# Patient Record
Sex: Female | Born: 1995 | Hispanic: No | Marital: Single | Smoking: Never smoker
Health system: Southern US, Community
[De-identification: ages and names within clinical notes are randomized; demographics above are authoritative.]

## PROBLEM LIST (undated history)

## (undated) ENCOUNTER — Inpatient Hospital Stay: Payer: Self-pay

## (undated) ENCOUNTER — Inpatient Hospital Stay (HOSPITAL_COMMUNITY): Payer: Self-pay

## (undated) DIAGNOSIS — K5090 Crohn's disease, unspecified, without complications: Secondary | ICD-10-CM

## (undated) DIAGNOSIS — R000 Tachycardia, unspecified: Secondary | ICD-10-CM

## (undated) DIAGNOSIS — B019 Varicella without complication: Secondary | ICD-10-CM

## (undated) DIAGNOSIS — D649 Anemia, unspecified: Secondary | ICD-10-CM

## (undated) DIAGNOSIS — G43909 Migraine, unspecified, not intractable, without status migrainosus: Secondary | ICD-10-CM

## (undated) HISTORY — DX: Varicella without complication: B01.9

## (undated) HISTORY — DX: Anemia, unspecified: D64.9

## (undated) HISTORY — DX: Migraine, unspecified, not intractable, without status migrainosus: G43.909

---
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
## 2015-04-27 HISTORY — PX: COLONOSCOPY: SHX174

## 2015-09-06 ENCOUNTER — Encounter: Payer: Self-pay | Admitting: *Deleted

## 2015-09-11 DIAGNOSIS — R0989 Other specified symptoms and signs involving the circulatory and respiratory systems: Secondary | ICD-10-CM

## 2015-09-12 ENCOUNTER — Encounter: Payer: Medicaid Other | Admitting: Cardiovascular Disease

## 2015-09-12 ENCOUNTER — Encounter: Payer: Self-pay | Admitting: Cardiovascular Disease

## 2015-09-13 NOTE — Progress Notes (Signed)
Patient ID: Cassidy Carr, female   DOB: 10-11-1996, 19 y.o.   MRN: [id number] No Show

## 2015-09-14 ENCOUNTER — Inpatient Hospital Stay (HOSPITAL_COMMUNITY): Payer: Medicaid Other

## 2015-09-14 ENCOUNTER — Inpatient Hospital Stay (HOSPITAL_COMMUNITY)
Admission: AD | Admit: 2015-09-14 | Discharge: 2015-09-19 | DRG: 781 | Disposition: A | Discharge status: Home / Self Care | Payer: Medicaid Other | Source: Ambulatory Visit | Attending: Obstetrics and Gynecology | Admitting: Obstetrics and Gynecology

## 2015-09-14 ENCOUNTER — Encounter (HOSPITAL_COMMUNITY): Payer: Self-pay | Admitting: *Deleted

## 2015-09-14 ENCOUNTER — Encounter: Payer: Self-pay | Admitting: Cardiovascular Disease

## 2015-09-14 DIAGNOSIS — R509 Fever, unspecified: Secondary | ICD-10-CM | POA: Diagnosis present

## 2015-09-14 DIAGNOSIS — R059 Cough, unspecified: Secondary | ICD-10-CM

## 2015-09-14 DIAGNOSIS — Z3A23 23 weeks gestation of pregnancy: Secondary | ICD-10-CM

## 2015-09-14 DIAGNOSIS — O99012 Anemia complicating pregnancy, second trimester: Secondary | ICD-10-CM | POA: Diagnosis present

## 2015-09-14 DIAGNOSIS — K50111 Crohn's disease of large intestine with rectal bleeding: Secondary | ICD-10-CM | POA: Diagnosis present

## 2015-09-14 DIAGNOSIS — E876 Hypokalemia: Secondary | ICD-10-CM | POA: Diagnosis present

## 2015-09-14 DIAGNOSIS — D638 Anemia in other chronic diseases classified elsewhere: Secondary | ICD-10-CM | POA: Diagnosis present

## 2015-09-14 DIAGNOSIS — O99612 Diseases of the digestive system complicating pregnancy, second trimester: Secondary | ICD-10-CM | POA: Diagnosis present

## 2015-09-14 DIAGNOSIS — O99282 Endocrine, nutritional and metabolic diseases complicating pregnancy, second trimester: Secondary | ICD-10-CM | POA: Diagnosis present

## 2015-09-14 DIAGNOSIS — E871 Hypo-osmolality and hyponatremia: Secondary | ICD-10-CM | POA: Diagnosis present

## 2015-09-14 DIAGNOSIS — K50918 Crohn's disease, unspecified, with other complication: Secondary | ICD-10-CM | POA: Diagnosis present

## 2015-09-14 DIAGNOSIS — R05 Cough: Secondary | ICD-10-CM

## 2015-09-14 HISTORY — DX: Tachycardia, unspecified: R00.0

## 2015-09-14 LAB — COMPREHENSIVE METABOLIC PANEL
ALT: 9 U/L — ABNORMAL LOW (ref 14–54)
AST: 15 U/L (ref 15–41)
Albumin: 2.1 g/dL — ABNORMAL LOW (ref 3.5–5.0)
Alkaline Phosphatase: 61 U/L (ref 38–126)
Anion gap: 8 (ref 5–15)
BUN: 7 mg/dL (ref 6–20)
CO2: 24 mmol/L (ref 22–32)
Calcium: 7.8 mg/dL — ABNORMAL LOW (ref 8.9–10.3)
Chloride: 99 mmol/L — ABNORMAL LOW (ref 101–111)
Creatinine, Ser: 0.47 mg/dL (ref 0.44–1.00)
GFR calc Af Amer: >60 mL/min (ref >60)
GFR calc non Af Amer: >60 mL/min (ref >60)
Glucose, Bld: 111 mg/dL — ABNORMAL HIGH (ref 65–99)
Potassium: 3 mmol/L — ABNORMAL LOW (ref 3.5–5.1)
Sodium: 131 mmol/L — ABNORMAL LOW (ref 135–145)
Total Bilirubin: 0.8 mg/dL (ref 0.3–1.2)
Total Protein: 6.7 g/dL (ref 6.5–8.1)

## 2015-09-14 LAB — CBC
HCT: 30.9 % — ABNORMAL LOW (ref 36.0–46.0)
Hemoglobin: 9.7 g/dL — ABNORMAL LOW (ref 12.0–15.0)
MCH: 26.3 pg (ref 26.0–34.0)
MCHC: 31.4 g/dL (ref 30.0–36.0)
MCV: 83.7 fL (ref 78.0–100.0)
Platelets: 413 10*3/uL — ABNORMAL HIGH (ref 150–400)
RBC: 3.69 MIL/uL — ABNORMAL LOW (ref 3.87–5.11)
RDW: 18.1 % — ABNORMAL HIGH (ref 11.5–15.5)
WBC: 12.5 10*3/uL — ABNORMAL HIGH (ref 4.0–10.5)

## 2015-09-14 LAB — URINALYSIS, ROUTINE W REFLEX MICROSCOPIC
Bilirubin Urine: NEGATIVE
Glucose, UA: NEGATIVE mg/dL
Hgb urine dipstick: NEGATIVE
Ketones, ur: 15 mg/dL — AB
Leukocytes, UA: NEGATIVE
Nitrite: NEGATIVE
Protein, ur: NEGATIVE mg/dL
Specific Gravity, Urine: 1.02 (ref 1.005–1.030)
pH: 5.5 (ref 5.0–8.0)

## 2015-09-14 MED ORDER — POTASSIUM CHLORIDE 20 MEQ/15ML (10%) PO SOLN
40.0000 meq | Freq: Once | ORAL | Status: AC
  Administered 2015-09-14: 40 meq via ORAL
  Filled 2015-09-14: qty 30

## 2015-09-14 MED ORDER — ACETAMINOPHEN 500 MG PO TABS
1000.0000 mg | ORAL_TABLET | Freq: Once | ORAL | Status: AC
  Administered 2015-09-14: 1000 mg via ORAL
  Filled 2015-09-14: qty 2

## 2015-09-14 MED ORDER — SODIUM CHLORIDE 0.9 % IV SOLN
Freq: Once | INTRAVENOUS | Status: AC
  Administered 2015-09-14: 20:00:00 via INTRAVENOUS
  Filled 2015-09-14: qty 1000

## 2015-09-14 MED ORDER — BUDESONIDE 3 MG PO CPEP
9.0000 mg | ORAL_CAPSULE | Freq: Every day | ORAL | Status: DC
  Administered 2015-09-15 – 2015-09-17 (×3): 9 mg via ORAL

## 2015-09-14 MED ORDER — PRENATAL MULTIVITAMIN CH
1.0000 | ORAL_TABLET | Freq: Every day | ORAL | Status: DC
  Administered 2015-09-17 – 2015-09-19 (×3): 1 via ORAL
  Filled 2015-09-14 (×4): qty 1

## 2015-09-14 MED ORDER — LACTATED RINGERS IV BOLUS (SEPSIS)
1000.0000 mL | INTRAVENOUS | Status: DC
  Administered 2015-09-14: 1000 mL via INTRAVENOUS

## 2015-09-14 MED ORDER — SODIUM CHLORIDE 0.9 % IV SOLN
1.0000 g | Freq: Once | INTRAVENOUS | Status: AC
  Administered 2015-09-14: 1 g via INTRAVENOUS
  Filled 2015-09-14: qty 10

## 2015-09-14 MED ORDER — ZOLPIDEM TARTRATE 5 MG PO TABS: 5.0000 mg | ORAL_TABLET | Freq: Every evening | ORAL | Status: DC | PRN

## 2015-09-14 MED ORDER — ACETAMINOPHEN 325 MG PO TABS
650.0000 mg | ORAL_TABLET | ORAL | Status: DC | PRN
  Administered 2015-09-15 (×2): 650 mg via ORAL
  Filled 2015-09-14 (×2): qty 2

## 2015-09-14 NOTE — MAU Provider Note (Signed)
History     CSN: [id number]  Arrival date and time: 09/14/15 1726   First Provider Initiated Contact with Patient 09/14/15 1826      Chief Complaint  Patient presents with  . Fever   HPI  Ms. Cassidy Carr is a 19 y.o. G1P0 at [redacted]w[redacted]d who presents to MAU today with complaint of fever. The patient states a history of Crohn's disease and a recent severe flair for which she was inpatient at [facility] x 4 days. She was discharged on 09/12/15 per patient. She states a significant amount of rectal bleeding at that time and required 3 unites of blood. She denies vaginal or rectal bleeding today. She denies LOF, sore throat or URI symptoms. She has had dry cough x 2 months that is unchanged. She reports good fetal movement.   OB History    Gravida Para Term Preterm AB TAB SAB Ectopic Multiple Living   1               Past Medical History  Diagnosis Date  . Crohn's disease (HCC)   . Anemia   . Chicken pox   . Migraines   . Tachycardia     Past Surgical History  Procedure Laterality Date  . Colonoscopy Bilateral 04/2015    Family History  Problem Relation Age of Onset  . Hyperthyroidism Father   . Hypercholesterolemia Mother   . Ulcerative colitis Brother     Social History  Substance Use Topics  . Smoking status: Never Smoker   . Smokeless tobacco: Never Used  . Alcohol Use: None    Allergies: No Known Allergies  Prescriptions prior to admission  Medication Sig Dispense Refill Last Dose  . Adalimumab (HUMIRA) 40 MG/0.8ML PSKT Inject 40 mg into the skin once a week.   09/11/2015  . budesonide (ENTOCORT EC) 3 MG 24 hr capsule Take 9 mg by mouth daily.   09/14/2015 at Unknown time  . ferrous sulfate 325 (65 FE) MG tablet Take 325 mg by mouth 3 (three) times daily with meals.   09/14/2015 at Unknown time  . loperamide (IMODIUM) 2 MG capsule Take 4 mg by mouth as needed for diarrhea or loose stools.   09/13/2015 at Unknown time  . Prenatal Vit-Fe Fumarate-FA (PRENATAL  MULTIVITAMIN) TABS tablet Take 1 tablet by mouth daily at 12 noon.   Past Week at Unknown time    Review of Systems  Constitutional: Positive for fever. Negative for chills and malaise/fatigue.  Cardiovascular: Negative for chest pain.  Gastrointestinal: Negative for nausea, vomiting, abdominal pain, diarrhea and constipation.  Genitourinary: Negative for dysuria, urgency and frequency.       Neg - vaginal bleeding, discharge, LOF   Physical Exam   Blood pressure 105/67, pulse 118, temperature 98.1 F (36.7 C), temperature source Oral, resp. rate 18, height  (1.473 m), weight 90 lb (40.824 kg), SpO2 99 %.  Physical Exam  Nursing note and vitals reviewed. Constitutional: She is oriented to person, place, and time. She appears well-developed and well-nourished. No distress.  HENT:  Head: Normocephalic and atraumatic.  Cardiovascular: Regular rhythm and normal heart sounds.  Tachycardia present.   Respiratory: Effort normal and breath sounds normal. No respiratory distress. She has no wheezes. She has no rales. She exhibits no tenderness.  GI: Soft. She exhibits no distension and no mass. There is no tenderness. There is no rebound and no guarding.  Neurological: She is alert and oriented to person, place, and time.  Skin: Skin  is warm and dry. No erythema.  Psychiatric: She has a normal mood and affect.    Results for orders placed or performed during the hospital encounter of 09/14/15 (from the past 24 hour(s))  Urinalysis, Routine w reflex microscopic ([facility])     Status: Abnormal   Collection Time: 09/14/15  5:45 PM  Result Value Ref Range   Color, Urine YELLOW YELLOW   APPearance CLEAR CLEAR   Specific Gravity, Urine 1.020 1.005 - 1.030   pH 5.5 5.0 - 8.0   Glucose, UA NEGATIVE NEGATIVE mg/dL   Hgb urine dipstick NEGATIVE NEGATIVE   Bilirubin Urine NEGATIVE NEGATIVE   Ketones, ur 15 (A) NEGATIVE mg/dL   Protein, ur NEGATIVE NEGATIVE mg/dL   Nitrite NEGATIVE  NEGATIVE   Leukocytes, UA NEGATIVE NEGATIVE  CBC     Status: Abnormal   Collection Time: 09/14/15  6:35 PM  Result Value Ref Range   WBC 12.5 (H) 4.0 - 10.5 K/uL   RBC 3.69 (L) 3.87 - 5.11 MIL/uL   Hemoglobin 9.7 (L) 12.0 - 15.0 g/dL   HCT 16.130.9 (L) 09.636.0 - 04.546.0 %   MCV 83.7 78.0 - 100.0 fL   MCH 26.3 26.0 - 34.0 pg   MCHC 31.4 30.0 - 36.0 g/dL   RDW 40.918.1 (H) 81.111.5 - 91.415.5 %   Platelets 413 (H) 150 - 400 K/uL  Comprehensive metabolic panel     Status: Abnormal   Collection Time: 09/14/15  6:35 PM  Result Value Ref Range   Sodium 131 (L) 135 - 145 mmol/L   Potassium 3.0 (L) 3.5 - 5.1 mmol/L   Chloride 99 (L) 101 - 111 mmol/L   CO2 24 22 - 32 mmol/L   Glucose, Bld 111 (H) 65 - 99 mg/dL   BUN 7 6 - 20 mg/dL   Creatinine, Ser 7.820.47 0.44 - 1.00 mg/dL   Calcium 7.8 (L) 8.9 - 10.3 mg/dL   Total Protein 6.7 6.5 - 8.1 g/dL   Albumin 2.1 (L) 3.5 - 5.0 g/dL   AST 15 15 - 41 U/L   ALT 9 (L) 14 - 54 U/L   Alkaline Phosphatase 61 38 - 126 U/L   Total Bilirubin 0.8 0.3 - 1.2 mg/dL   GFR calc non Af Amer >60 >60 mL/min   GFR calc Af Amer >60 >60 mL/min   Anion gap 8 5 - 15    MAU Course  Procedures None  MDM FHR - 205 bpm with doppler CBC, CMP, UA today 1000 mg Tylenol given 1840 - Discussed patient with Dr. Claiborne Billingsallahan. She recommends blood cultures, CXR and EKG today.  Patient refused EKG stating that she is "always tachycardic"  CMP results reveal hypokalemia, hyponatremia and hypocalcemia. Will order IV NS with K+ and IVPK of Calcium gluconate.  Called Dr. Claiborne Billingsallahan with patient update. [facility] who have just recently cared for the patient. Recommends Flu swab and stool cultures, including for C.Diff.  Flu swab and Stool cultures obtained. Patient temperature is now normal.  Dr. Claiborne Billingsallahan has consulted with [facility] and is waiting for further information from that MD. She will return call to MAU with plan shortly. Patient will continue to receive IV fluids  until then.  2045 - Patient receiving IV fluids and K+, Na and Ca replacement. Awaiting plan from Dr. Claiborne Billingsallahan. Care [facility]aque[facility], CNM  Marny LowensteinJulie N Wenzel, PA-C  09/14/2015, 8:48 PM  Assessment and Plan  Fever of unknown origin -Dr. Claiborne Billings called back after talking to [facility] GI Dr. Roosvelt Harps to Obs pt at [facility] as long as C. Dif neg. If Positive, will transfer to [facility]  -Repeat blood cultures at 2300. -CMV IgG, IgM -Check Lactic Acid to eval for sepsis -Flu PCR pending  Hypokalemia -KCl 40 mEq  Crohn's -Continue Humira, Entocort   Tachycardia, improving, asymptomatic -Refused EKG  [redacted] weeks Gestation -Doppler QS  [place], CNM 09/14/2015 10:44 PM

## 2015-09-14 NOTE — H&P (Signed)
Chief Complaint  Patient presents with  . Fever, loose stools   HPI  Cassidy Carr is a 19 y.o. G1P0 at 9271w1d who presents to MAU today with complaint of fever. The patient states a history of Crohn's disease and a recent severe flair for which she [facility] days. She was discharged on 09/12/15 per patient. She states she had a significant amount of rectal bleeding at that time and a profound anemia with an Hb of 4, requiring 3 units of blood. [facility] she continued to have diarrhea, but no longer bloody.  She reports that she has history of tachycardia and was [facility].  She was supposed to f/u outpt with cardiology. She denies any chest pain or shortness of breath. She denies vaginal or rectal bleeding today. She denies LOF, sore throat or URI symptoms. She has had dry cough x 2 months that is unchanged. She reports good fetal movement.  She received a flu vaccination 2 days ago.  OB History    Gravida Para Term Preterm AB TAB SAB Ectopic Multiple Living   1               Past Medical History  Diagnosis Date  . Crohn's disease (HCC)   . Anemia   . Chicken pox   . Migraines   . Tachycardia     Past Surgical History  Procedure Laterality Date  . Colonoscopy Bilateral 04/2015    Family History  Problem Relation Age of Onset  . Hyperthyroidism Father   . Hypercholesterolemia Mother   . Ulcerative colitis Brother     Social History  Substance Use Topics  . Smoking status: Never Smoker   . Smokeless tobacco: Never Used  . Alcohol Use: None    Allergies: No Known Allergies  Prescriptions prior to admission  Medication Sig Dispense Refill Last Dose  . Adalimumab (HUMIRA) 40 MG/0.8ML PSKT Inject 40 mg into the skin once a week.   09/11/2015  . budesonide (ENTOCORT EC) 3 MG 24 hr capsule Take 9 mg by mouth  daily.   09/14/2015 at Unknown time  . ferrous sulfate 325 (65 FE) MG tablet Take 325 mg by mouth 3 (three) times daily with meals.   09/14/2015 at Unknown time  . loperamide (IMODIUM) 2 MG capsule Take 4 mg by mouth as needed for diarrhea or loose stools.   09/13/2015 at Unknown time  . Prenatal Vit-Fe Fumarate-FA (PRENATAL MULTIVITAMIN) TABS tablet Take 1 tablet by mouth daily at 12 noon.   Past Week at Unknown time    Review of Systems  Constitutional: Positive for fever. Negative for chills and malaise/fatigue.  Cardiovascular: Negative for chest pain.  Gastrointestinal: Negative for nausea, vomiting, abdominal pain, diarrhea and constipation.  Genitourinary: Negative for dysuria, urgency and frequency.   Neg - vaginal bleeding, discharge, LOF   Physical Exam   Blood pressure 105/67, pulse 118, temperature 98.1 F (36.7 C), temperature source Oral, resp. rate 18, height 4\' 10"  (1.473 m), weight 90 lb (40.824 kg), SpO2 99 %.  Physical Exam  Nursing note and vitals reviewed. Constitutional: She is oriented to person, place, and time. She appears well-developed and well-nourished. No distress.  HENT:  Head: Normocephalic and atraumatic.  Cardiovascular: Regular rhythm and normal heart sounds. Tachycardia present.  Respiratory: Effort normal and breath sounds normal. No respiratory distress. She has no wheezes. She has no rales. She exhibits no tenderness.  GI:  Soft. She exhibits no distension and no mass. There is no tenderness. There is no rebound and no guarding.  Neurological: She is alert and oriented to person, place, and time.  Skin: Skin is warm and dry. No erythema.  Psychiatric: She has a normal mood and affect.     Lab Results Last 24 Hours    Results for orders placed or performed during the hospital encounter of 09/14/15 (from the past 24 hour(s))  Urinalysis, Routine w reflex microscopic (not at [facility]) Status: Abnormal    Collection Time: 09/14/15 5:45 PM  Result Value Ref Range   Color, Urine YELLOW YELLOW   APPearance CLEAR CLEAR   Specific Gravity, Urine 1.020 1.005 - 1.030   pH 5.5 5.0 - 8.0   Glucose, UA NEGATIVE NEGATIVE mg/dL   Hgb urine dipstick NEGATIVE NEGATIVE   Bilirubin Urine NEGATIVE NEGATIVE   Ketones, ur 15 (A) NEGATIVE mg/dL   Protein, ur NEGATIVE NEGATIVE mg/dL   Nitrite NEGATIVE NEGATIVE   Leukocytes, UA NEGATIVE NEGATIVE  CBC Status: Abnormal   Collection Time: 09/14/15 6:35 PM  Result Value Ref Range   WBC 12.5 (H) 4.0 - 10.5 K/uL   RBC 3.69 (L) 3.87 - 5.11 MIL/uL   Hemoglobin 9.7 (L) 12.0 - 15.0 g/dL   HCT 16.1 (L) 09.6 - 04.5 %   MCV 83.7 78.0 - 100.0 fL   MCH 26.3 26.0 - 34.0 pg   MCHC 31.4 30.0 - 36.0 g/dL   RDW 40.9 (H) 81.1 - 91.4 %   Platelets 413 (H) 150 - 400 K/uL  Comprehensive metabolic panel Status: Abnormal   Collection Time: 09/14/15 6:35 PM  Result Value Ref Range   Sodium 131 (L) 135 - 145 mmol/L   Potassium 3.0 (L) 3.5 - 5.1 mmol/L   Chloride 99 (L) 101 - 111 mmol/L   CO2 24 22 - 32 mmol/L   Glucose, Bld 111 (H) 65 - 99 mg/dL   BUN 7 6 - 20 mg/dL   Creatinine, Ser 7.82 0.44 - 1.00 mg/dL   Calcium 7.8 (L) 8.9 - 10.3 mg/dL   Total Protein 6.7 6.5 - 8.1 g/dL   Albumin 2.1 (L) 3.5 - 5.0 g/dL   AST 15 15 - 41 U/L   ALT 9 (L) 14 - 54 U/L   Alkaline Phosphatase 61 38 - 126 U/L   Total Bilirubin 0.8 0.3 - 1.2 mg/dL   GFR calc non Af Amer >60 >60 mL/min   GFR calc Af Amer >60 >60 mL/min   Anion gap 8 5 - 15       FHR - 205 bpm with doppler 1000 mg Tylenol given  A/P [redacted]w[redacted]d with recent Crohn's flair with hospital admission and today with new onset fever Currently with diarrhea but no further blood in stool.  Pt not appearing septic or particularly sick. Diff dx for fever: Crohn's  related inflammatory fever, flu, CMV exposure via transfusion, sepsis, medication rxn CXR neg, UA neg and EKG refused by patient. Flu swab, stool cultures, C.Diff PCR, CMV IgM and IgG given recent transfusion, Blood cultures x 2, repeat in 4 hrs all pending. CMP results reveal hypokalemia, hyponatremia and hypocalcemia: IV NS with K+ and IVPK of Calcium gluconate.  Contacted GI service at [facility] and spoke with Dr. Harlen Labs [phone]).  We discussed her recent admission, his main concern being C. Diff which she is at high risk for, and for which could significantly impact her Crohn's ds.  If C. Diff is positive  he recommends starting oral Vancomycin  qid and transfer to [facility] where GI can monitor her closely.  If C. Diff is negative she can use Immodium prn.  He advises to continue her Entacort  qd and weekly Humira injections, continue observation.

## 2015-09-14 NOTE — Progress Notes (Signed)
Respiratory at bedside for EKG, pt refused.

## 2015-09-14 NOTE — MAU Note (Signed)
Woke up was sweating, throughout the day has gotten more hot.  Checked her temp, says it was 104.6. Then went down to 103.1 around 1600. Did not have chills. Eyes are watery.  Has been coughing for 2 months.  Got the flu shot 2 days ago, was hospitalized for Crohn's disease, is being sent to a cardiologist for tachycardia

## 2015-09-15 DIAGNOSIS — R509 Fever, unspecified: Secondary | ICD-10-CM | POA: Diagnosis present

## 2015-09-15 LAB — COMPREHENSIVE METABOLIC PANEL
ALT: 9 U/L — AB (ref 14–54)
AST: 14 U/L — ABNORMAL LOW (ref 15–41)
Albumin: 1.8 g/dL — ABNORMAL LOW (ref 3.5–5.0)
Alkaline Phosphatase: 54 U/L (ref 38–126)
Anion gap: 7 (ref 5–15)
BILIRUBIN TOTAL: 0.5 mg/dL (ref 0.3–1.2)
BUN: 6 mg/dL (ref 6–20)
CALCIUM: 8 mg/dL — AB (ref 8.9–10.3)
CHLORIDE: 102 mmol/L (ref 101–111)
CO2: 24 mmol/L (ref 22–32)
CREATININE: 0.39 mg/dL — AB (ref 0.44–1.00)
Glucose, Bld: 127 mg/dL — ABNORMAL HIGH (ref 65–99)
Potassium: 3 mmol/L — ABNORMAL LOW (ref 3.5–5.1)
Sodium: 133 mmol/L — ABNORMAL LOW (ref 135–145)
TOTAL PROTEIN: 5.8 g/dL — AB (ref 6.5–8.1)

## 2015-09-15 LAB — TYPE AND SCREEN
ABO/RH(D): A NEG
ANTIBODY SCREEN: NEGATIVE

## 2015-09-15 LAB — CBC WITH DIFFERENTIAL/PLATELET
BASOS PCT: 0 %
Basophils Absolute: 0 10*3/uL (ref 0.0–0.1)
EOS ABS: 0.1 10*3/uL (ref 0.0–0.7)
EOS PCT: 1 %
HEMATOCRIT: 25.8 % — AB (ref 36.0–46.0)
Hemoglobin: 8.4 g/dL — ABNORMAL LOW (ref 12.0–15.0)
Lymphocytes Relative: 32 %
Lymphs Abs: 3 10*3/uL (ref 0.7–4.0)
MCH: 27.1 pg (ref 26.0–34.0)
MCHC: 32.6 g/dL (ref 30.0–36.0)
MCV: 83.2 fL (ref 78.0–100.0)
MONO ABS: 1.2 10*3/uL — AB (ref 0.1–1.0)
MONOS PCT: 13 %
NEUTROS ABS: 4.9 10*3/uL (ref 1.7–7.7)
Neutrophils Relative %: 53 %
PLATELETS: 346 10*3/uL (ref 150–400)
RBC: 3.1 MIL/uL — ABNORMAL LOW (ref 3.87–5.11)
RDW: 18 % — AB (ref 11.5–15.5)
WBC: 9.3 10*3/uL (ref 4.0–10.5)

## 2015-09-15 LAB — ABO/RH: ABO/RH(D): A NEG

## 2015-09-15 LAB — INFLUENZA PANEL BY PCR (TYPE A & B)
H1N1FLUPCR: NOT DETECTED
INFLAPCR: NEGATIVE
INFLBPCR: NEGATIVE

## 2015-09-15 LAB — C DIFFICILE QUICK SCREEN W PCR REFLEX
C DIFFICILE (CDIFF) INTERP: NEGATIVE
C DIFFICILE (CDIFF) TOXIN: NEGATIVE
C DIFFICLE (CDIFF) ANTIGEN: NEGATIVE

## 2015-09-15 LAB — LACTIC ACID, PLASMA
Lactic Acid, Venous: 3.1 mmol/L (ref 0.5–2.0)
Lactic Acid, Venous: 2 mmol/L (ref 0.5–2.0)

## 2015-09-15 MED ORDER — POTASSIUM CHLORIDE 2 MEQ/ML IV SOLN
INTRAVENOUS | Status: DC
  Administered 2015-09-15 – 2015-09-19 (×11): via INTRAVENOUS
  Filled 2015-09-15 (×13): qty 1000

## 2015-09-15 NOTE — Progress Notes (Signed)
Results for Tamala BariLJIC, Adrianne (MRN [id number]) as of 09/15/2015 06:34  Ref. Range 09/15/2015 00:15  Lactic Acid, Venous Latest Ref Range: 0.5-2.0 mmol/L 3.1 (HH)   Critical lab result called to Dr. Claiborne Billingsallahan at [id number], verbal communication.  No new orders given.

## 2015-09-15 NOTE — Progress Notes (Signed)
Patient ID: Cassidy Carr, female   DOB: [birth year], 19 y.o.   MRN: [id number]  HD#31  HPI: 19 year old G1 P0 at 22+3 days admitted for fever of unknown origin. The patient's pregnancy has been complicated by active Crohn's. She was recently hospitalized at [facility] for a blood transfusion due to a hemoglobin of 4.3. She was seen in follow-up on 09/13/2015 and was feeling much better. On the day of admission she started developing fevers and chills. Temperature at home was 103. Upon arrival to MAU the patient was tachycardic with a fever of 103.1 - influenza vaccine was administered on 09/11/2015 at [facility] - Cardiac echo WNL 09/13/2015 - Current meds Entocort, Humira, po IRon, PNV  No recurrent fever since last night. Pt is receiving tylenol Filed Vitals:   09/14/15 1953 09/14/15 2043 09/14/15 2208 09/15/15 0552  BP: 105/67  106/70 97/52  Pulse: 141 118 116 118  Temp: 99.4 F (37.4 C) 98.1 F (36.7 C) 98.3 F (36.8 C) 99.2 F (37.3 C)  TempSrc: Oral Oral Oral Oral  Resp: Height:      Weight:    87 lb 0.8 oz (39.486 kg)  SpO2: 99%  99% 100%    AOX3, NAD, pale and thin appearing Normal work of breathing FHTs 162  Results for orders placed or performed during the hospital encounter of 09/14/15 (from the past 24 hour(s))  Urinalysis, Routine w reflex microscopic (not at [facility])     Status: Abnormal   Collection Time: 09/14/15  5:45 PM  Result Value Ref Range   Color, Urine YELLOW YELLOW   APPearance CLEAR CLEAR   Specific Gravity, Urine 1.020 1.005 - 1.030   pH 5.5 5.0 - 8.0   Glucose, UA NEGATIVE NEGATIVE mg/dL   Hgb urine dipstick NEGATIVE NEGATIVE   Bilirubin Urine NEGATIVE NEGATIVE   Ketones, ur 15 (A) NEGATIVE mg/dL   Protein, ur NEGATIVE NEGATIVE mg/dL   Nitrite NEGATIVE NEGATIVE   Leukocytes, UA NEGATIVE NEGATIVE  CBC     Status: Abnormal   Collection Time: 09/14/15  6:35 PM  Result Value Ref Range   WBC 12.5 (H) 4.0 - 10.5 K/uL   RBC 3.69 (L) 3.87 - 5.11 MIL/uL   Hemoglobin 9.7 (L) 12.0 - 15.0 g/dL   HCT 78.2 (L) 95.6 - 21.3 %   MCV 83.7 78.0 - 100.0 fL   MCH 26.3 26.0 - 34.0 pg   MCHC 31.4 30.0 - 36.0 g/dL   RDW 08.6 (H) 57.8 - 46.9 %   Platelets 413 (H) 150 - 400 K/uL  Comprehensive metabolic panel     Status: Abnormal   Collection Time: 09/14/15  6:35 PM  Result Value Ref Range   Sodium 131 (L) 135 - 145 mmol/L   Potassium 3.0 (L) 3.5 - 5.1 mmol/L   Chloride 99 (L) 101 - 111 mmol/L   CO2 24 22 - 32 mmol/L   Glucose, Bld 111 (H) 65 - 99 mg/dL   BUN 7 6 - 20 mg/dL   Creatinine, Ser 6.29 0.44 - 1.00 mg/dL   Calcium 7.8 (L) 8.9 - 10.3 mg/dL   Total Protein 6.7 6.5 - 8.1 g/dL   Albumin 2.1 (L) 3.5 - 5.0 g/dL   AST 15 15 - 41 U/L   ALT 9 (L) 14 - 54 U/L   Alkaline Phosphatase 61 38 - 126 U/L   Total Bilirubin 0.8 0.3 - 1.2 mg/dL   GFR calc non Af Amer >60 >60  mL/min   GFR calc Af Amer >60 >60 mL/min   Anion gap 8 5 - 15  ABO/Rh     Status: None   Collection Time: 09/14/15  6:35 PM  Result Value Ref Range   ABO/RH(D) A NEG   C difficile quick scan w PCR reflex     Status: None   Collection Time: 09/14/15  8:30 PM  Result Value Ref Range   C Diff antigen NEGATIVE NEGATIVE   C Diff toxin NEGATIVE NEGATIVE   C Diff interpretation Negative for toxigenic C. difficile   Influenza panel by PCR (type A & B, H1N1)     Status: None   Collection Time: 09/14/15  8:40 PM  Result Value Ref Range   Influenza A By PCR NEGATIVE NEGATIVE   Influenza B By PCR NEGATIVE NEGATIVE   H1N1 flu by pcr NOT DETECTED NOT DETECTED  Type and screen [facility]     Status: None   Collection Time: 09/14/15  9:20 PM  Result Value Ref Range   ABO/RH(D) A NEG    Antibody Screen NEG    Sample Expiration 09/17/2015   Lactic acid, plasma     Status: Abnormal   Collection Time: 09/15/15 12:15 AM  Result Value Ref Range   Lactic Acid, Venous 3.1 (HH) 0.5 - 2.0 mmol/L  Culture, blood (routine x 2)     Status: None  (Preliminary result)   Collection Time: 09/15/15 12:15 AM  Result Value Ref Range   Specimen Description BLOOD RIGHT ARM    Special Requests      BOTTLES DRAWN AEROBIC ONLY 10ML Performed at [facility]    Culture PENDING    Report Status PENDING   Lactic acid, plasma     Status: None   Collection Time: 09/15/15  2:12 AM  Result Value Ref Range   Lactic Acid, Venous 2.0 0.5 - 2.0 mmol/L  CBC with Differential     Status: Abnormal   Collection Time: 09/15/15  5:30 AM  Result Value Ref Range   WBC 9.3 4.0 - 10.5 K/uL   RBC 3.10 (L) 3.87 - 5.11 MIL/uL   Hemoglobin 8.4 (L) 12.0 - 15.0 g/dL   HCT 40.925.8 (L) 81.136.0 - 91.446.0 %   MCV 83.2 78.0 - 100.0 fL   MCH 27.1 26.0 - 34.0 pg   MCHC 32.6 30.0 - 36.0 g/dL   RDW 78.218.0 (H) 95.611.5 - 21.315.5 %   Platelets 346 150 - 400 K/uL   Neutrophils Relative % 53 %   Neutro Abs 4.9 1.7 - 7.7 K/uL   Lymphocytes Relative 32 %   Lymphs Abs 3.0 0.7 - 4.0 K/uL   Monocytes Relative 13 %   Monocytes Absolute 1.2 (H) 0.1 - 1.0 K/uL   Eosinophils Relative 1 %   Eosinophils Absolute 0.1 0.0 - 0.7 K/uL   Basophils Relative 0 %   Basophils Absolute 0.0 0.0 - 0.1 K/uL   A/P: Fever of unknown origin 1) blood cultures in process 2) UA on admission negative. No urine culture sent. Will send urine culture 3) Stool culture pending, c diff negative 4) Not on any abx at the moment 5) Hypokalemia on admission, CMP not re-ordered. Just ordered 6) AF since yesterday. Has received tylenol. WBC improved from admission 12.5-->9.3 7) Continued diarrhea, but no blood 8) Continue in patient monitoring

## 2015-09-16 LAB — CMV ANTIBODY, IGG (EIA)

## 2015-09-16 LAB — CULTURE, OB URINE
Culture: 3000
Special Requests: NORMAL

## 2015-09-16 LAB — CMV IGM: CMV IgM: <30 AU/mL (ref 0.0–29.9)

## 2015-09-16 NOTE — Progress Notes (Signed)
Pt had another temp spike last PM. She is still having some diarrhea and has mild diffuse abd discomfort. She has a nl WBC, stool cultures are neg,blood cultures still pending, c diff neg , u/a neg. She is tachy but this has been present for at least 1-2 weeks. [facility] exaccerbation of Crohn's Given 3 units of blood. Now on Humara and Entocort. She did have a flu vac 3 days ago. [facility] and she declined   PE: Lungs -wnl        Abd- gravid, no rebound or guarding, no masses, no palp ctxs.        Back- neg CVAT        Ext-wnl  IMP/ Fever, unknown origin         Diarrhea  PLAN/  GI consult

## 2015-09-16 NOTE — Consult Note (Signed)
[facility] Consult Note  Referring Provider: No ref. provider found Primary Care Physician:  No primary care provider on file. Primary Gastroenterologist:  Dr.  Laurel Dimmer Complaint: Fever and diarrhea HPI: Cassidy Carr is an 19 y.o. Latino female  [redacted] weeks pregnant recently admitted from November 11 of November 16 to [facility] for a flare with a hemoglobin of 5. She was transfused 2 units with resulting hemoglobin of 8.9. She was restarted on Humira and started on Entocort as well as iron. She was admitted 2 days ago with fever and persistence of diarrhea although nonbloody. She had a chest x-ray urine culture and C. difficile toxin as well as stool culture which were negative and continued on her present medicines. He has not had any more rectal bleeding and states that her diarrhea is about her baseline. She spiked a temperature to 103 last night. She is maintaining her normal WBC count. She's having only mild abdominal pain and does not look acutely ill.  Past Medical History  Diagnosis Date  . Crohn's disease ([place])   . Anemia   . Chicken pox   . Migraines   . Tachycardia     Past Surgical History  Procedure Laterality Date  . Colonoscopy Bilateral 04/2015    Medications Prior to Admission  Medication Sig Dispense Refill  . Adalimumab (HUMIRA) 40 MG/0.8ML PSKT Inject 40 mg into the skin once a week.    . budesonide (ENTOCORT EC) 3 MG 24 hr capsule Take 9 mg by mouth daily.    . ferrous sulfate 325 (65 FE) MG tablet Take 325 mg by mouth 3 (three) times daily with meals.    Marland Kitchen loperamide (IMODIUM) 2 MG capsule Take 4 mg by mouth as needed for diarrhea or loose stools.    . Prenatal Vit-Fe Fumarate-FA (PRENATAL MULTIVITAMIN) TABS tablet Take 1 tablet by mouth daily at 12 noon.      Allergies: No Known Allergies  Family History  Problem Relation Age of Onset  . Hyperthyroidism Father   . Hypercholesterolemia Mother   . Ulcerative colitis Brother      Social History:  reports that she has never smoked. She has never used smokeless tobacco. Her alcohol and drug histories are not on file.  Review of Systems: negative except as above   Blood pressure 98/65, pulse 116, temperature 98.6 F (37 C), temperature source Oral, resp. rate 18, height 4' 10" (1.473 m), weight 40.824 kg (90 lb), SpO2 100 %. Head: Normocephalic, without obvious abnormality, atraumatic Neck: no adenopathy, no carotid bruit, no JVD, supple, symmetrical, trachea midline and thyroid not enlarged, symmetric, no tenderness/mass/nodules Resp: clear to auscultation bilaterally Cardio: regular rate and rhythm, S1, S2 normal, no murmur, click, rub or gallop GI: Abdomen soft protuberant with normoactive bowel sounds. No hepatomegaly masses or guarding. Extremities: extremities normal, atraumatic, no cyanosis or edema  Results for orders placed or performed during the hospital encounter of 09/14/15 (from the past 48 hour(s))  Urinalysis, Routine w reflex microscopic (not at [facility])     Status: Abnormal   Collection Time: 09/14/15  5:45 PM  Result Value Ref Range   Color, Urine YELLOW YELLOW   APPearance CLEAR CLEAR   Specific Gravity, Urine 1.020 1.005 - 1.030   pH 5.5 5.0 - 8.0   Glucose, UA NEGATIVE NEGATIVE mg/dL   Hgb urine dipstick NEGATIVE NEGATIVE   Bilirubin Urine NEGATIVE NEGATIVE   Ketones, ur 15 (A) NEGATIVE mg/dL   Protein, ur NEGATIVE NEGATIVE mg/dL  Nitrite NEGATIVE NEGATIVE   Leukocytes, UA NEGATIVE NEGATIVE    Comment: MICROSCOPIC NOT DONE ON URINES WITH NEGATIVE PROTEIN, BLOOD, LEUKOCYTES, NITRITE, OR GLUCOSE <1000 mg/dL.  CBC     Status: Abnormal   Collection Time: 09/14/15  6:35 PM  Result Value Ref Range   WBC 12.5 (H) 4.0 - 10.5 K/uL   RBC 3.69 (L) 3.87 - 5.11 MIL/uL   Hemoglobin 9.7 (L) 12.0 - 15.0 g/dL   HCT 30.9 (L) 36.0 - 46.0 %   MCV 83.7 78.0 - 100.0 fL   MCH 26.3 26.0 - 34.0 pg   MCHC 31.4 30.0 - 36.0 g/dL   RDW 18.1 (H) 11.5 - 15.5 %    Platelets 413 (H) 150 - 400 K/uL  Comprehensive metabolic panel     Status: Abnormal   Collection Time: 09/14/15  6:35 PM  Result Value Ref Range   Sodium 131 (L) 135 - 145 mmol/L   Potassium 3.0 (L) 3.5 - 5.1 mmol/L   Chloride 99 (L) 101 - 111 mmol/L   CO2 24 22 - 32 mmol/L   Glucose, Bld 111 (H) 65 - 99 mg/dL   BUN 7 6 - 20 mg/dL   Creatinine, Ser 0.47 0.44 - 1.00 mg/dL   Calcium 7.8 (L) 8.9 - 10.3 mg/dL   Total Protein 6.7 6.5 - 8.1 g/dL   Albumin 2.1 (L) 3.5 - 5.0 g/dL   AST 15 15 - 41 U/L   ALT 9 (L) 14 - 54 U/L   Alkaline Phosphatase 61 38 - 126 U/L   Total Bilirubin 0.8 0.3 - 1.2 mg/dL   GFR calc non Af Amer >60 >60 mL/min   GFR calc Af Amer >60 >60 mL/min    Comment: (NOTE) The eGFR has been calculated using the CKD EPI equation. This calculation has not been validated in all clinical situations. eGFR's persistently <60 mL/min signify possible Chronic Kidney Disease.    Anion gap 8 5 - 15  ABO/Rh     Status: None   Collection Time: 09/14/15  6:35 PM  Result Value Ref Range   ABO/RH(D) A NEG   Culture, blood (routine x 2)     Status: None (Preliminary result)   Collection Time: 09/14/15  6:57 PM  Result Value Ref Range   Specimen Description BLOOD LEFT ARM    Special Requests BOTTLES DRAWN AEROBIC ONLY 10CC    Culture      NO GROWTH < 24 HOURS Performed at [facility]    Report Status PENDING   Culture, blood (routine x 2)     Status: None (Preliminary result)   Collection Time: 09/14/15  7:50 PM  Result Value Ref Range   Specimen Description BLOOD LEFT ARM    Special Requests BOTTLES DRAWN AEROBIC AND ANAEROBIC 10CC    Culture      NO GROWTH < 24 HOURS Performed at [facility]    Report Status PENDING   C difficile quick scan w PCR reflex     Status: None   Collection Time: 09/14/15  8:30 PM  Result Value Ref Range   C Diff antigen NEGATIVE NEGATIVE   C Diff toxin NEGATIVE NEGATIVE   C Diff interpretation Negative for toxigenic C.  difficile     Comment: Performed at [facility]  Influenza panel by PCR (type A & B, H1N1)     Status: None   Collection Time: 09/14/15  8:40 PM  Result Value Ref Range   Influenza A  By PCR NEGATIVE NEGATIVE   Influenza B By PCR NEGATIVE NEGATIVE   H1N1 flu by pcr NOT DETECTED NOT DETECTED    Comment:        The Xpert Flu assay (FDA approved for nasal aspirates or washes and nasopharyngeal swab specimens), is intended as an aid in the diagnosis of influenza and should not be used as a sole basis for treatment. Performed at [facility]   Type and screen [place]     Status: None   Collection Time: 09/14/15  9:20 PM  Result Value Ref Range   ABO/RH(D) A NEG    Antibody Screen NEG    Sample Expiration 09/17/2015   Lactic acid, plasma     Status: Abnormal   Collection Time: 09/15/15 12:15 AM  Result Value Ref Range   Lactic Acid, Venous 3.1 (HH) 0.5 - 2.0 mmol/L    Comment: CRITICAL RESULT CALLED TO, READ BACK BY AND VERIFIED WITH: KINSELL,S RN 09/15/2015 0222 JORDANS Performed at [facility]   Culture, blood (routine x 2)     Status: None (Preliminary result)   Collection Time: 09/15/15 12:15 AM  Result Value Ref Range   Specimen Description BLOOD RIGHT ARM    Special Requests      BOTTLES DRAWN AEROBIC ONLY 10ML Performed at [facility]    Culture PENDING    Report Status PENDING   Lactic acid, plasma     Status: None   Collection Time: 09/15/15  2:12 AM  Result Value Ref Range   Lactic Acid, Venous 2.0 0.5 - 2.0 mmol/L    Comment: Performed at [facility]  CBC with Differential     Status: Abnormal   Collection Time: 09/15/15  5:30 AM  Result Value Ref Range   WBC 9.3 4.0 - 10.5 K/uL    Comment: WHITE COUNT CONFIRMED ON SMEAR   RBC 3.10 (L) 3.87 - 5.11 MIL/uL   Hemoglobin 8.4 (L) 12.0 - 15.0 g/dL   HCT 25.8 (L) 36.0 - 46.0 %   MCV 83.2 78.0 - 100.0 fL   MCH 27.1 26.0 - 34.0 pg   MCHC 32.6 30.0 - 36.0  g/dL   RDW 18.0 (H) 11.5 - 15.5 %   Platelets 346 150 - 400 K/uL   Neutrophils Relative % 53 %   Neutro Abs 4.9 1.7 - 7.7 K/uL   Lymphocytes Relative 32 %   Lymphs Abs 3.0 0.7 - 4.0 K/uL   Monocytes Relative 13 %   Monocytes Absolute 1.2 (H) 0.1 - 1.0 K/uL   Eosinophils Relative 1 %   Eosinophils Absolute 0.1 0.0 - 0.7 K/uL   Basophils Relative 0 %   Basophils Absolute 0.0 0.0 - 0.1 K/uL  Comprehensive metabolic panel     Status: Abnormal   Collection Time: 09/15/15  2:23 PM  Result Value Ref Range   Sodium 133 (L) 135 - 145 mmol/L   Potassium 3.0 (L) 3.5 - 5.1 mmol/L   Chloride 102 101 - 111 mmol/L   CO2 24 22 - 32 mmol/L   Glucose, Bld 127 (H) 65 - 99 mg/dL   BUN 6 6 - 20 mg/dL   Creatinine, Ser 0.39 (L) 0.44 - 1.00 mg/dL   Calcium 8.0 (L) 8.9 - 10.3 mg/dL   Total Protein 5.8 (L) 6.5 - 8.1 g/dL   Albumin 1.8 (L) 3.5 - 5.0 g/dL   AST 14 (L) 15 - 41 U/L   ALT 9 (L) 14 -  54 U/L   Alkaline Phosphatase 54 38 - 126 U/L   Total Bilirubin 0.5 0.3 - 1.2 mg/dL   GFR calc non Af Amer >60 >60 mL/min   GFR calc Af Amer >60 >60 mL/min    Comment: (NOTE) The eGFR has been calculated using the CKD EPI equation. This calculation has not been validated in all clinical situations. eGFR's persistently <60 mL/min signify possible Chronic Kidney Disease.    Anion gap 7 5 - 15   Dg Chest 2 View  09/14/2015  CLINICAL DATA:  Fever.  Cough. EXAM: CHEST  2 VIEW COMPARISON:  None. FINDINGS: The heart size and mediastinal contours are within normal limits. Both lungs are clear. The visualized skeletal structures are unremarkable. IMPRESSION: Normal exam. Electronically Signed   By: Lorriane Shire M.D.   On: 09/14/2015 19:45    Assessment: Fever in a patient with Crohn's disease who is [redacted] weeks pregnant etiology unclear from studies to date and symptoms. Her Crohn's disease clearly involves the colon and it is not clear whether involves small intestine. Plan:  Will try to avoid empiric  antibiotics or CT scanning for now. Will obtain GI pathogens panel and continue to monitor for change in symptoms or recurrent fevers but may need to obtain more in depth scanning should fever persist or symptoms worsen. Timberlynn Kizziah C 09/16/2015, 11:23 AM  Pager [phone] If no answer or after 5 PM call [phone]

## 2015-09-17 LAB — COMPREHENSIVE METABOLIC PANEL
ALT: 8 U/L — AB (ref 14–54)
AST: 14 U/L — ABNORMAL LOW (ref 15–41)
Albumin: 1.6 g/dL — ABNORMAL LOW (ref 3.5–5.0)
Alkaline Phosphatase: 50 U/L (ref 38–126)
Anion gap: 6 (ref 5–15)
BUN: <5 mg/dL — ABNORMAL LOW (ref 6–20)
CALCIUM: 8.2 mg/dL — AB (ref 8.9–10.3)
CHLORIDE: 103 mmol/L (ref 101–111)
CO2: 24 mmol/L (ref 22–32)
CREATININE: 0.31 mg/dL — AB (ref 0.44–1.00)
Glucose, Bld: 90 mg/dL (ref 65–99)
Potassium: 4.1 mmol/L (ref 3.5–5.1)
Sodium: 133 mmol/L — ABNORMAL LOW (ref 135–145)
Total Bilirubin: 0.5 mg/dL (ref 0.3–1.2)
Total Protein: 5.1 g/dL — ABNORMAL LOW (ref 6.5–8.1)

## 2015-09-17 LAB — CBC WITH DIFFERENTIAL/PLATELET
Basophils Absolute: 0 10*3/uL (ref 0.0–0.1)
Basophils Relative: 0 %
EOS PCT: 2 %
Eosinophils Absolute: 0.1 10*3/uL (ref 0.0–0.7)
HCT: 25 % — ABNORMAL LOW (ref 36.0–46.0)
Hemoglobin: 7.9 g/dL — ABNORMAL LOW (ref 12.0–15.0)
LYMPHS ABS: 2.3 10*3/uL (ref 0.7–4.0)
LYMPHS PCT: 32 %
MCH: 26.8 pg (ref 26.0–34.0)
MCHC: 31.6 g/dL (ref 30.0–36.0)
MCV: 84.7 fL (ref 78.0–100.0)
MONO ABS: 0.9 10*3/uL (ref 0.1–1.0)
MONOS PCT: 12 %
Neutro Abs: 3.9 10*3/uL (ref 1.7–7.7)
Neutrophils Relative %: 54 %
PLATELETS: 393 10*3/uL (ref 150–400)
RBC: 2.95 MIL/uL — ABNORMAL LOW (ref 3.87–5.11)
RDW: 18.1 % — AB (ref 11.5–15.5)
WBC: 7.2 10*3/uL (ref 4.0–10.5)

## 2015-09-17 MED ORDER — METHYLPREDNISOLONE SODIUM SUCC 125 MG IJ SOLR
60.0000 mg | Freq: Two times a day (BID) | INTRAMUSCULAR | Status: DC
  Administered 2015-09-17 – 2015-09-19 (×5): 60 mg via INTRAVENOUS
  Filled 2015-09-17 (×9): qty 0.96

## 2015-09-17 NOTE — Progress Notes (Addendum)
Pt stable this afternoon has had 5 more loose stools, the first 2 had blood but smaller amount, the last 3 without blo. Spoke with GI earlier today, budesonide (entocort) discontinued.  Solumedrol 60mg  IV q12 started.  Pt ok to have Humira 40mg  [place] tomorrow if no temp spikes.  Not on formulary, pt has Rx at home and states BF can bring it tonight.  Hb decreased from 8.4 to 7.9  Will recheck in am. GI pathogen panel still pending.

## 2015-09-17 NOTE — Progress Notes (Signed)
Patient ID: Cassidy Carr, female   DOB: 1996-03-18, 19 y.o.   MRN: [id number] [facility] Progress Note  Cassidy Carr 19 y.o. 1996-03-18   Subjective: Having red blood per rectum with loose stools overnight and today reports 3-4 episodes last night. Complaining of abdominal pain.  Objective: Vital signs in last 24 hours: Filed Vitals:   09/17/15 0559 09/17/15 1000  BP: 96/53 91/50  Pulse: 119 120  Temp: 99.2 F (37.3 C) 98.2 F (36.8 C)  Resp: 18 18    Physical Exam: Gen: alert, no acute distress, thin HEENT: anicteric CV: RRR Chest: CTA B Abd: upper quadrant tenderness with guarding, protuberant, +BS  Lab Results:  Recent Labs  09/15/15 1423 09/17/15 1055  NA 133* 133*  K 3.0* PENDING  CL 102 103  CO2 24 24  GLUCOSE 127* 90  BUN 6 PENDING  CREATININE 0.39* PENDING  CALCIUM 8.0* 8.2*    Recent Labs  09/15/15 1423 09/17/15 1055  AST 14* 14*  ALT 9* 8*  ALKPHOS 54 50  BILITOT 0.5 0.5  PROT 5.8* 5.1*  ALBUMIN 1.8* 1.6*    Recent Labs  09/15/15 0530 09/17/15 1055  WBC 9.3 7.2  NEUTROABS 4.9 3.9  HGB 8.4* 7.9*  HCT 25.8* 25.0*  MCV 83.2 84.7  PLT 346 393   No results for input(s): LABPROT, INR in the last 72 hours.    Assessment/Plan: [redacted] week pregnant patient with a Crohn's colitis flare on Humira and Budesonide. Low grade temp overnight. Will change to IV steroids and d/c Budesonide. D/W plan for IV steroids with Dr. Claiborne Billingsallahan. D/C enteric precautions (C. Diff negative). GI pathogen panel pending but doubt infectious colitis. Due for Humira injection (40 mg SQ) tomorrow and ok to give unless fever spike again. Supportive care. Will follow.   Kalayla Shadden C. 09/17/2015, 11:57 AM  Pager [phone]  If no answer or after 5 PM call [phone]

## 2015-09-17 NOTE — Progress Notes (Signed)
HD#4 G1P0 22.4 with crohn's exacerbation, recent admission with transfusion for anemia after bloody stools.  Pt reports no abd pain but new onset of resumed blood stools this am, multiple episodes with heavy bleeding.  Tolerating po intake.  Denies CP/SOB, dizziness.  Filed Vitals:   09/16/15 1732 09/16/15 2137 09/17/15 0300 09/17/15 0559  BP: 84/54 100/67 93/50 96/53   Pulse: 121 116 115 119  Temp: 98.4 F (36.9 C) 99.3 F (37.4 C) 98.9 F (37.2 C) 99.2 F (37.3 C)  TempSrc: Oral Oral Oral Oral  Resp: 20 17 16 18   Height:      Weight:    40.937 kg (90 lb 4 oz)  SpO2: 100% 100% 99% 100%   Abd: NT Ext: no CT  Lab Results  Component Value Date   WBC 9.3 09/15/2015   HGB 8.4* 09/15/2015   HCT 25.8* 09/15/2015   MCV 83.2 09/15/2015   PLT 346 09/15/2015    --/--/A NEG (11/18 2120)  A/P 22.4 with Crohn's exacerbation and fever Presented with fever, now afebrile >24hrs.  No abx.  W/u for fever neg CXR, neg blood cultures, neg CMV serology, neg flu PCR, neg stool culture, neg C.diff GI pathogen panel pending, GI has not yet seen pt this am, RN will call them and alert them of new bleeding. Will recheck CBC/CMP this am given new bleeding, pt currently asymptomatic. FHT by doppler q shift.  Philip AspenALLAHAN, [name]

## 2015-09-18 LAB — COMPREHENSIVE METABOLIC PANEL
ALK PHOS: 50 U/L (ref 38–126)
ALT: 9 U/L — ABNORMAL LOW (ref 14–54)
ANION GAP: 6 (ref 5–15)
AST: 11 U/L — ABNORMAL LOW (ref 15–41)
Albumin: 1.7 g/dL — ABNORMAL LOW (ref 3.5–5.0)
BILIRUBIN TOTAL: 0.2 mg/dL — AB (ref 0.3–1.2)
BUN: <5 mg/dL — ABNORMAL LOW (ref 6–20)
CALCIUM: 8.4 mg/dL — AB (ref 8.9–10.3)
CO2: 25 mmol/L (ref 22–32)
Chloride: 105 mmol/L (ref 101–111)
Creatinine, Ser: <0.3 mg/dL — ABNORMAL LOW (ref 0.44–1.00)
Glucose, Bld: 117 mg/dL — ABNORMAL HIGH (ref 65–99)
Potassium: 4.1 mmol/L (ref 3.5–5.1)
Sodium: 136 mmol/L (ref 135–145)
TOTAL PROTEIN: 5.6 g/dL — AB (ref 6.5–8.1)

## 2015-09-18 LAB — STOOL CULTURE

## 2015-09-18 LAB — CBC WITH DIFFERENTIAL/PLATELET
Basophils Absolute: 0 10*3/uL (ref 0.0–0.1)
Basophils Relative: 0 %
EOS ABS: 0 10*3/uL (ref 0.0–0.7)
Eosinophils Relative: 0 %
HEMATOCRIT: 23.4 % — AB (ref 36.0–46.0)
HEMOGLOBIN: 7.5 g/dL — AB (ref 12.0–15.0)
LYMPHS ABS: 1.8 10*3/uL (ref 0.7–4.0)
Lymphocytes Relative: 40 %
MCH: 27.2 pg (ref 26.0–34.0)
MCHC: 32.1 g/dL (ref 30.0–36.0)
MCV: 84.8 fL (ref 78.0–100.0)
MONOS PCT: 6 %
Monocytes Absolute: 0.3 10*3/uL (ref 0.1–1.0)
NEUTROS ABS: 2.3 10*3/uL (ref 1.7–7.7)
NEUTROS PCT: 54 %
Platelets: 410 10*3/uL — ABNORMAL HIGH (ref 150–400)
RBC: 2.76 MIL/uL — ABNORMAL LOW (ref 3.87–5.11)
RDW: 17.9 % — ABNORMAL HIGH (ref 11.5–15.5)
WBC: 4.4 10*3/uL (ref 4.0–10.5)

## 2015-09-18 NOTE — Progress Notes (Signed)
Patient ID: Cassidy Carr, female   DOB: 06/12/1996, 19 y.o.   MRN: [id number]  HD# 5  S: Patient states she's feeling better on the current steroids than she did on the entercort, feels less bloated. States diarrhea has "slowed down". Blood in stool much less.   Pt recognizes that when she eats foods "she's not supposed to" it upsets her stomach and she has bleeding. Having a hard time not giving into cravings.  Ceasar Mons:  Filed Vitals:   09/17/15 2201 09/18/15 0609 09/18/15 1344 09/18/15 1844  BP: 94/51 93/51 94/57  138/72  Pulse: 110 89 97 108  Temp: 98.2 F (36.8 C) 97.4 F (36.3 C) 98.7 F (37.1 C) 98.2 F (36.8 C)  TempSrc: Oral Oral Oral Oral  Resp: 18 16 16 16   Height:      Weight:  89 lb 12 oz (40.71 kg)    SpO2: 100% 100% 100% 100%   AOX3, NAD Normal work of breathing  FHR 158 by doppler  Results for orders placed or performed during the hospital encounter of 09/14/15 (from the past 24 hour(s))  CBC with Differential/Platelet     Status: Abnormal   Collection Time: 09/18/15  5:30 AM  Result Value Ref Range   WBC 4.4 4.0 - 10.5 K/uL   RBC 2.76 (L) 3.87 - 5.11 MIL/uL   Hemoglobin 7.5 (L) 12.0 - 15.0 g/dL   HCT 40.923.4 (L) 81.136.0 - 91.446.0 %   MCV 84.8 78.0 - 100.0 fL   MCH 27.2 26.0 - 34.0 pg   MCHC 32.1 30.0 - 36.0 g/dL   RDW 78.217.9 (H) 95.611.5 - 21.315.5 %   Platelets 410 (H) 150 - 400 K/uL   Neutrophils Relative % 54 %   Neutro Abs 2.3 1.7 - 7.7 K/uL   Lymphocytes Relative 40 %   Lymphs Abs 1.8 0.7 - 4.0 K/uL   Monocytes Relative 6 %   Monocytes Absolute 0.3 0.1 - 1.0 K/uL   Eosinophils Relative 0 %   Eosinophils Absolute 0.0 0.0 - 0.7 K/uL   Basophils Relative 0 %   Basophils Absolute 0.0 0.0 - 0.1 K/uL  Comprehensive metabolic panel     Status: Abnormal   Collection Time: 09/18/15  5:30 AM  Result Value Ref Range   Sodium 136 135 - 145 mmol/L   Potassium 4.1 3.5 - 5.1 mmol/L   Chloride 105 101 - 111 mmol/L   CO2 25 22 - 32 mmol/L   Glucose, Bld 117 (H) 65 - 99 mg/dL   BUN  <5 (L) 6 - 20 mg/dL   Creatinine, Ser <0.86<0.30 (L) 0.44 - 1.00 mg/dL   Calcium 8.4 (L) 8.9 - 10.3 mg/dL   Total Protein 5.6 (L) 6.5 - 8.1 g/dL   Albumin 1.7 (L) 3.5 - 5.0 g/dL   AST 11 (L) 15 - 41 U/L   ALT 9 (L) 14 - 54 U/L   Alkaline Phosphatase 50 38 - 126 U/L   Total Bilirubin 0.2 (L) 0.3 - 1.2 mg/dL   GFR calc non Af Amer NOT CALCULATED >60 mL/min   GFR calc Af Amer NOT CALCULATED >60 mL/min   Anion gap 6 5 - 15   A/P 1) Appreciate GIs assistance 2) Discussed importance of avoiding foods that will upset her stomach and cause bleeding 3) Per RN, 2 stools that she saw had only specks of blood. 4) Recheck Hgb in am

## 2015-09-18 NOTE — Progress Notes (Signed)
Patient ID: Cassidy Carr Ambroise, female   DOB: [birth year], 19 y.o.   MRN: [id number] [facility] Progress [facility]  Cassidy Carr Pavlovich 19 y.o. [birth year]   Subjective: Reports 3 episodes of bloody diarrhea overnight (she did not show staff so no documentation of it). Reports amount of blood lower than 2 days ago. Less abdominal pain. Tolerating diet. Nurse at bedside during my evaluation.  Objective: Vital signs in last 24 hours: Filed Vitals:   09/17/15 2201 09/18/15 0609  BP: 94/51 93/51  Pulse: 110 89  Temp: 98.2 F (36.8 C) 97.4 F (36.3 C)  Resp: 18 16    Physical Exam: Gen: alert, no acute distress, thin CV: RRR Chest: CTA B Abd: less tender in upper quadrant, protuberant, +BS Ext: no edema  Lab Results:  Recent Labs  09/17/15 1055 09/18/15 0530  NA 133* 136  K 4.1 4.1  CL 103 105  CO2 24 25  GLUCOSE 90 117*  BUN <5* <5*  CREATININE 0.31* <0.30*  CALCIUM 8.2* 8.4*    Recent Labs  09/17/15 1055 09/18/15 0530  AST 14* 11*  ALT 8* 9*  ALKPHOS 50 50  BILITOT 0.5 0.2*  PROT 5.1* 5.6*  ALBUMIN 1.6* 1.7*    Recent Labs  09/17/15 1055 09/18/15 0530  WBC 7.2 4.4  NEUTROABS 3.9 2.3  HGB 7.9* 7.5*  HCT 25.0* 23.4*  MCV 84.7 84.8  PLT 393 410*   No results for input(s): LABPROT, INR in the last 72 hours.    Assessment/Plan: [redacted] week pregnant woman with a Crohn's colitis flare started on IV steroids yesterday. Due for Humira injection today and nurse will watch pt self-adminster SQ injection. Hgb 7.5. Continue IV steroids and consider changing to PO Prednisone 60 mg/day if doing ok. If Hgb stable and bleeding subsiding may be able to d/c Wed afternoon or Thursday morning but will have to see how she does today. Will f/u tomorrow.   Zahrah Sutherlin C. 09/18/2015, 11:18 AM  Pager [phone]  If no answer or after 5 PM call [phone]

## 2015-09-19 LAB — CULTURE, BLOOD (ROUTINE X 2)
Culture: NO GROWTH
Culture: NO GROWTH

## 2015-09-19 LAB — GI PATHOGEN PANEL BY PCR, STOOL
C difficile toxin A/B: NOT DETECTED
Campylobacter by PCR: NOT DETECTED
Cryptosporidium by PCR: NOT DETECTED
E COLI (ETEC) LT/ST: NOT DETECTED
E COLI (STEC): NOT DETECTED
E coli 0157 by PCR: NOT DETECTED
G LAMBLIA BY PCR: NOT DETECTED
NOROVIRUS G1/G2: NOT DETECTED
ROTAVIRUS A BY PCR: NOT DETECTED
SALMONELLA BY PCR: NOT DETECTED
SHIGELLA BY PCR: NOT DETECTED

## 2015-09-19 LAB — COMPREHENSIVE METABOLIC PANEL
ALT: 10 U/L — AB (ref 14–54)
AST: 15 U/L (ref 15–41)
Albumin: 1.7 g/dL — ABNORMAL LOW (ref 3.5–5.0)
Alkaline Phosphatase: 50 U/L (ref 38–126)
Anion gap: 7 (ref 5–15)
CHLORIDE: 104 mmol/L (ref 101–111)
CO2: 25 mmol/L (ref 22–32)
Calcium: 8.5 mg/dL — ABNORMAL LOW (ref 8.9–10.3)
Glucose, Bld: 105 mg/dL — ABNORMAL HIGH (ref 65–99)
POTASSIUM: 4.6 mmol/L (ref 3.5–5.1)
SODIUM: 136 mmol/L (ref 135–145)
Total Bilirubin: 0.2 mg/dL — ABNORMAL LOW (ref 0.3–1.2)
Total Protein: 5.3 g/dL — ABNORMAL LOW (ref 6.5–8.1)

## 2015-09-19 LAB — CBC
HCT: 24.6 % — ABNORMAL LOW (ref 36.0–46.0)
Hemoglobin: 7.7 g/dL — ABNORMAL LOW (ref 12.0–15.0)
MCH: 26.8 pg (ref 26.0–34.0)
MCHC: 31.3 g/dL (ref 30.0–36.0)
MCV: 85.7 fL (ref 78.0–100.0)
PLATELETS: 431 10*3/uL — AB (ref 150–400)
RBC: 2.87 MIL/uL — AB (ref 3.87–5.11)
RDW: 17.9 % — AB (ref 11.5–15.5)
WBC: 5.8 10*3/uL (ref 4.0–10.5)

## 2015-09-19 MED ORDER — PREDNISONE 20 MG PO TABS: 60.0000 mg | ORAL_TABLET | Freq: Every day | ORAL | Status: AC

## 2015-09-19 NOTE — Discharge Summary (Signed)
Obstetric Discharge Summary Reason for Admission: Fever of unknown origin Prenatal Procedures: Blood, urine, and stool cultures, IVF hydration, Gastroenterology consult Intrapartum Procedures: NA undelivered  Postpartum Procedures: NA undelivered Complications-Operative and Postpartum: NA, undelivered HEMOGLOBIN  Date Value Ref Range Status  09/19/2015 7.7* 12.0 - 15.0 g/dL Final   HCT  Date Value Ref Range Status  09/19/2015 24.6* 36.0 - 46.0 % Final    Physical Exam:  General: alert, cooperative and appears stated age  Abd: improved tenderness Uterus nontender  Discharge Diagnoses: Crohns exacerbation, anemia of chronic disease  Discharge Information: Date: 09/19/2015 Activity: pelvic rest Diet: routine and Crohns diet Medications: Prednisone 60mg , [facility] to taper Condition: improved Instructions: refer to practice specific booklet Discharge to: home     Keiland Pickering H. 09/19/2015, 2:17 PM

## 2015-09-19 NOTE — Progress Notes (Signed)
Pt discharged to home with significant other.  Condition stable.  Home medications Humira and budesomide returned to patient.  Pt ambulated to car with Luisa DagoJ. Bass, NT.  No equipment for home ordered at discharge.

## 2015-09-19 NOTE — Progress Notes (Addendum)
Patient ID: Cassidy Carr, female   DOB: 1996-01-06, 19 y.o.   MRN: [id number] [facility]  Cassidy Carr 19 y.o. 1996-01-06   Subjective: Small amounts of loose nonbloody stool overnight. Minimal abdominal pain. Feels ok. Nurse and Dr. Tenny Crawoss present during my evaluation. Objective: Vital signs in last 24 hours: Filed Vitals:   09/19/15 0636 09/19/15 1250  BP: 91/55 138/75  Pulse: 95 80  Temp: 97.8 F (36.6 C) 98.2 F (36.8 C)  Resp: 18 18    Physical Exam: Gen: alert, no acute distress, thin HEENT: anicteric CV: RRR Chest: CTA B Abd: minimal tenderness without guarding, soft, nondistended, +BS  Lab Results:  Recent Labs  09/18/15 0530 09/19/15 0530  NA 136 136  K 4.1 4.6  CL 105 104  CO2 25 25  GLUCOSE 117* 105*  BUN <5* <5*  CREATININE <0.30* <0.30*  CALCIUM 8.4* 8.5*    Recent Labs  09/18/15 0530 09/19/15 0530  AST 11* 15  ALT 9* 10*  ALKPHOS 50 50  BILITOT 0.2* 0.2*  PROT 5.6* 5.3*  ALBUMIN 1.7* 1.7*    Recent Labs  09/17/15 1055 09/18/15 0530 09/19/15 0530  WBC 7.2 4.4 5.8  NEUTROABS 3.9 2.3  --   HGB 7.9* 7.5* 7.7*  HCT 25.0* 23.4* 24.6*  MCV 84.7 84.8 85.7  PLT 393 410* 431*   No results for input(s): LABPROT, INR in the last 72 hours.    Assessment/Plan: [redacted] week pregnant patient with a resolving Crohn' flare - ok to change to Prednisone 60 mg/day and defer tapering to [facility] who she sees this Friday. Continue weekly Humira. Ok to go home from our standpoint. D/W Dr. Tenny Crawoss.   Cassidy Carr C. 09/19/2015, 1:21 PM  Pager [phone]  If no answer or after 5 PM call [phone]

## 2015-09-20 LAB — CULTURE, BLOOD (ROUTINE X 2)
Culture: NO GROWTH
Culture: NO GROWTH

## 2016-07-19 ENCOUNTER — Encounter (HOSPITAL_COMMUNITY): Payer: Self-pay

## 2016-08-28 IMAGING — CR DG CHEST 2V
2 series · 2 of 2 positions shown · non-contrast
Comparison: None.

CLINICAL DATA: Fever.  Cough.

EXAM:
CHEST  2 VIEW

[chest pa]
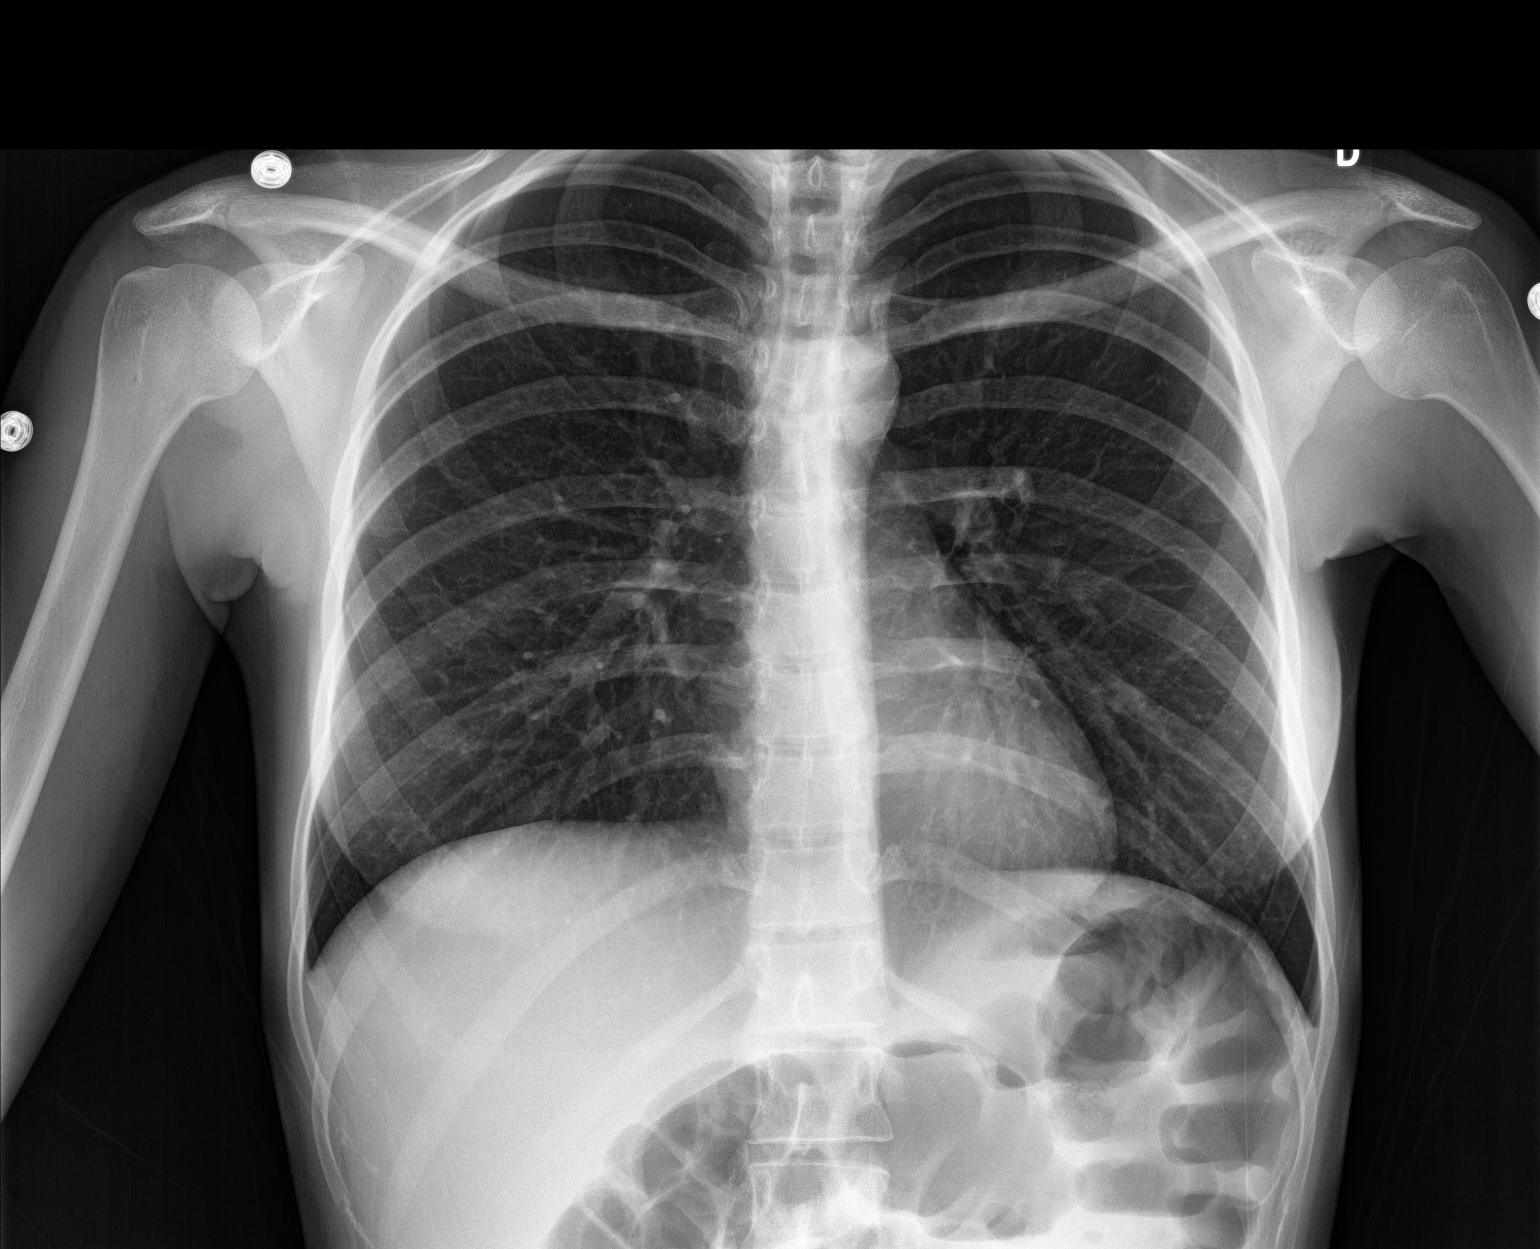

[chest lat]
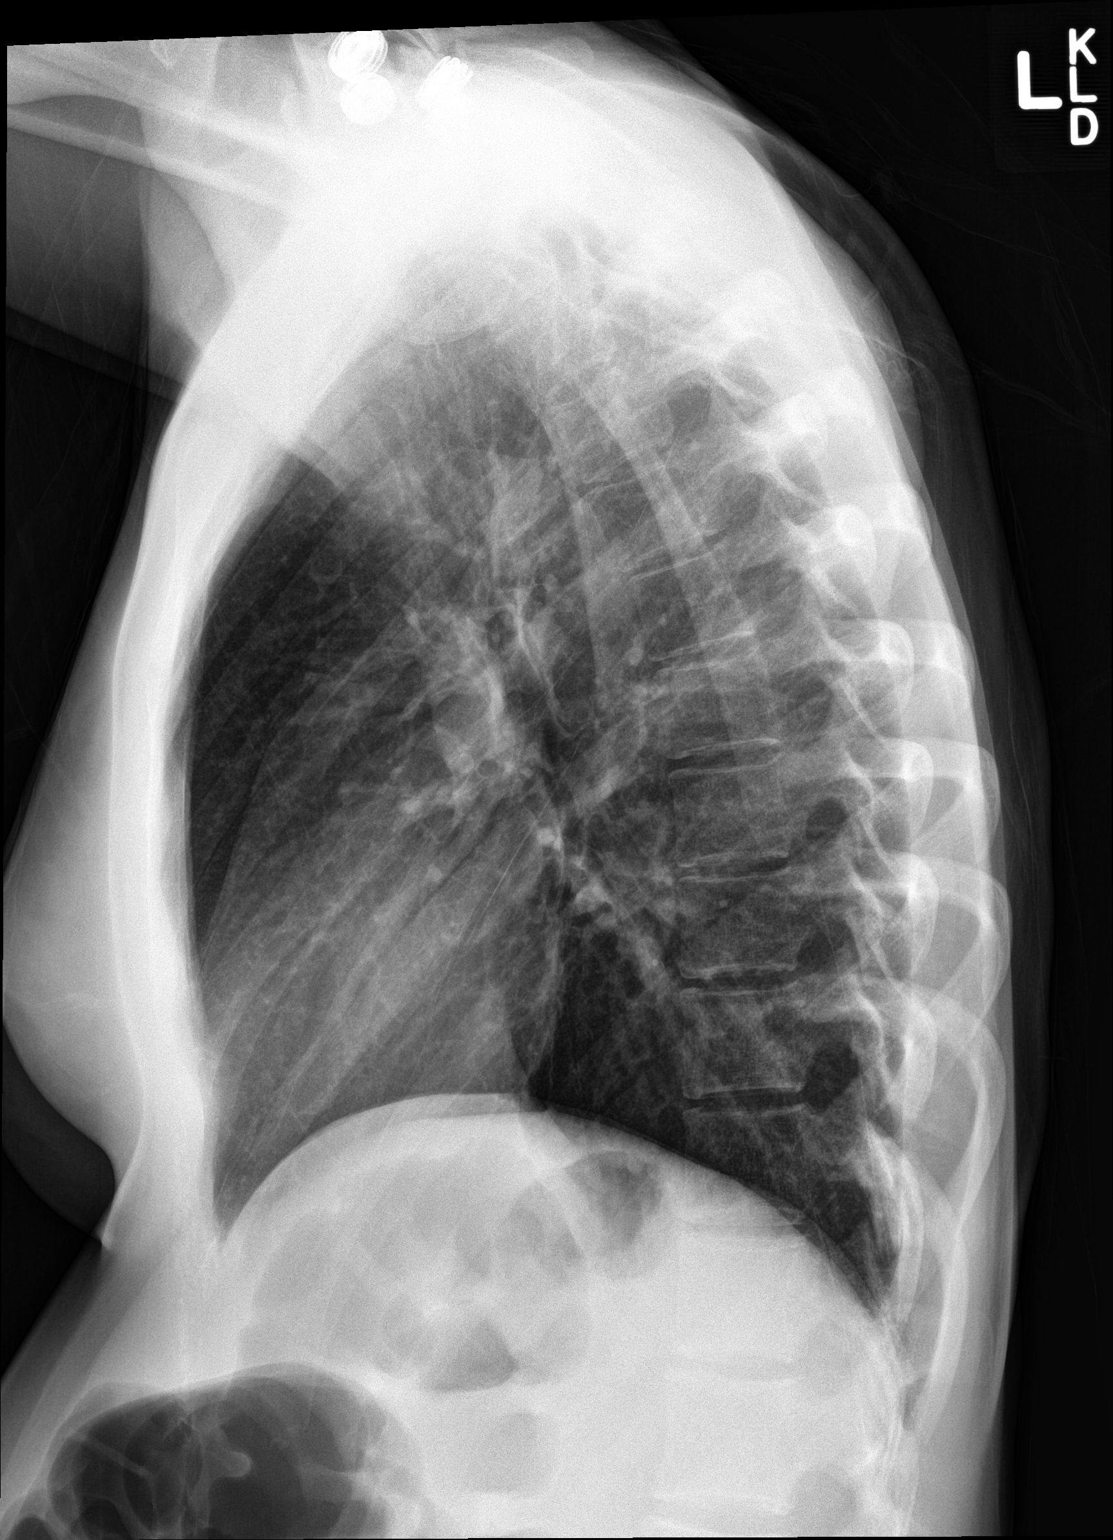

[2 of 2 positions shown; findings below may reference images not displayed]

FINDINGS: The heart size and mediastinal contours are within normal limits.
Both lungs are clear. The visualized skeletal structures are
unremarkable.
IMPRESSION: Normal exam.

## 2016-09-24 ENCOUNTER — Ambulatory Visit: Payer: Self-pay

## 2016-12-02 ENCOUNTER — Encounter (INDEPENDENT_AMBULATORY_CARE_PROVIDER_SITE_OTHER): Payer: Self-pay

## 2016-12-02 ENCOUNTER — Ambulatory Visit (INDEPENDENT_AMBULATORY_CARE_PROVIDER_SITE_OTHER): Payer: Medicaid Other | Admitting: Podiatry

## 2016-12-02 ENCOUNTER — Encounter: Payer: Self-pay | Admitting: Podiatry

## 2016-12-02 DIAGNOSIS — L03032 Cellulitis of left toe: Secondary | ICD-10-CM | POA: Diagnosis not present

## 2016-12-02 DIAGNOSIS — L600 Ingrowing nail: Secondary | ICD-10-CM | POA: Diagnosis not present

## 2016-12-02 DIAGNOSIS — L02612 Cutaneous abscess of left foot: Secondary | ICD-10-CM

## 2016-12-02 MED ORDER — CEPHALEXIN 500 MG PO CAPS: 500.0000 mg | ORAL_CAPSULE | Freq: Two times a day (BID) | ORAL | 0 refills | Status: AC

## 2016-12-02 NOTE — Patient Instructions (Signed)

## 2016-12-02 NOTE — Progress Notes (Signed)
   Subjective:    Patient ID: Cassidy Carr, female    DOB: 1996/06/24, 21 y.o.   MRN: [id number]  HPI  21 year old female presents the office today for concerns of an infected ingrown toenail to the left big toe which has been ongoing for about 1 week. She states that the ingrown toenail has been ongoing for 2 weeks but then she try to cut it and make the area worse and has been swollen and red and painful since. She has noticed a small amount of pus from the nail. No other complaints.   She is not breastfeeding or pregnant currently.   Review of Systems  All other systems reviewed and are negative.      Objective:   Physical Exam General: AAO x3, NAD  Dermatological: There is incurvation on the medial aspect of left hallux toenail with tenderness to palpation. There is granulation tissue present on the nail. Small amount of pus is expressed. There is localized edema and erythema along the nail. There is no ascending cellulitis. There is no malodor. No tenderness or incurvation along the lateral nail bord  Vascular: Dorsalis Pedis artery and Posterior Tibial artery pedal pulses are 2/4 bilateral with immedate capillary fill time. Pedal hair growth present. There is no pain with calf compression, swelling, warmth, erythema.   Neruologic: Grossly intact via light touch bilateral. Vibratory intact via tuning fork bilateral. Protective threshold with Semmes Wienstein monofilament intact to all pedal sites bilateral.   Musculoskeletal: No gross boney pedal deformities bilateral. No pain, crepitus, or limitation noted with foot and ankle range of motion bilateral. Muscular strength 5/5 in all groups tested bilateral.  Gait: Unassisted, Nonantalgic.     Assessment & Plan:  21 year old female with Left hallux ingrown toenail with infection, abscess  -Treatment options discussed including all alternatives, risks, and complications -Etiology of symptoms were discussed -At this time, recommended  I&d and partial nail removal without chemical matricectomy to the medial due to infection. Risks and complications were discussed with the patient for which they understand and  verbally consent to the procedure. Under sterile conditions a total of 3 mL of a mixture of 2% lidocaine plain and 0.5% Marcaine plain was infiltrated in a hallux block fashion. Once anesthetized, the skin was prepped in sterile fashion. A tourniquet was then applied. Next the medial border of the hallux nail border was sharply excised making sure to remove the entire offending nail border. Once the nail was  Removed, the area was debrided and the underlying skin was intact. The area was irrigated and hemostasis was obtained.  A dry sterile dressing was applied. After application of the dressing the tourniquet was removed and there is found to be an immediate capillary refill time to the digit. The patient tolerated the procedure well any complications. Post procedure instructions were discussed the patient for which he verbally understood. Follow-up in one week for nail check or sooner if any problems are to arise. Discussed signs/symptoms of worsening infection and directed to call the office immediately should any occur or go directly to the emergency room. In the meantime, encouraged to call the office with any questions, concerns, changes symptoms. -Keflex  Ovid CurdMatthew Chapman Matteucci, DPM

## 2016-12-09 ENCOUNTER — Encounter: Payer: Self-pay | Admitting: Podiatry

## 2016-12-09 ENCOUNTER — Ambulatory Visit (INDEPENDENT_AMBULATORY_CARE_PROVIDER_SITE_OTHER): Payer: Self-pay | Admitting: Podiatry

## 2016-12-09 DIAGNOSIS — L600 Ingrowing nail: Secondary | ICD-10-CM

## 2016-12-09 DIAGNOSIS — Z9889 Other specified postprocedural states: Secondary | ICD-10-CM

## 2016-12-09 NOTE — Patient Instructions (Signed)

## 2016-12-09 NOTE — Progress Notes (Signed)
Subjective: Cassidy Carr is a 21 y.o.  female returns to office today for follow up evaluation after having left Hallux medial partial nail avulsion performed. Patient has been soaking using epsom salts and applying topical antibiotic covered with bandaid daily. Denies any pain, swelling, redness. Patient denies fevers, chills, nausea, vomiting. Denies any calf pain, chest pain, SOB.   Objective:  Vitals: Reviewed  General: Well developed, nourished, in no acute distress, alert and oriented x3   Dermatology: Skin is warm, dry and supple bilateral. Left hallux nail border appears to be clean, dry, with mild granular tissue and surrounding scab. There is no surrounding erythema, edema, drainage/purulence. The remaining nails appear unremarkable at this time. There are no other lesions or other signs of infection present.  Neurovascular status: Intact. No lower extremity swelling; No pain with calf compression bilateral.  Musculoskeletal: Decreased tenderness to palpation of the medial hallux nail fold. Muscular strength within normal limits bilateral.   Assesement and Plan: S/p partial nail avulsion, doing well.   -Continue soaking in epsom salts twice a day followed by antibiotic ointment and a band-aid. Can leave uncovered at night. Continue this until completely healed.  -If the area has not healed in 2 weeks, call the office for follow-up appointment, or sooner if any problems arise.  -Monitor for any signs/symptoms of infection. Call the office immediately if any occur or go directly to the emergency room. Call with any questions/concerns.  Ovid CurdMatthew Mackey Varricchio, DPM
